# Patient Record
Sex: Male | Born: 1990 | Race: White | Hispanic: No | Marital: Single | Smoking: Never smoker
Health system: Southern US, Community
[De-identification: ages and names within clinical notes are randomized; demographics above are authoritative.]

## PROBLEM LIST (undated history)

## (undated) DIAGNOSIS — J45909 Unspecified asthma, uncomplicated: Secondary | ICD-10-CM

## (undated) HISTORY — PX: TONSILLECTOMY: SUR1361

---

## 2016-02-24 ENCOUNTER — Encounter (HOSPITAL_BASED_OUTPATIENT_CLINIC_OR_DEPARTMENT_OTHER): Payer: Self-pay | Admitting: *Deleted

## 2016-02-24 ENCOUNTER — Emergency Department (HOSPITAL_BASED_OUTPATIENT_CLINIC_OR_DEPARTMENT_OTHER): Payer: 59

## 2016-02-24 ENCOUNTER — Emergency Department (HOSPITAL_BASED_OUTPATIENT_CLINIC_OR_DEPARTMENT_OTHER)
Admission: EM | Admit: 2016-02-24 | Discharge: 2016-02-24 | Disposition: A | Discharge status: Home / Self Care | Payer: 59 | Attending: Emergency Medicine | Admitting: Emergency Medicine

## 2016-02-24 DIAGNOSIS — J45909 Unspecified asthma, uncomplicated: Secondary | ICD-10-CM | POA: Insufficient documentation

## 2016-02-24 DIAGNOSIS — R1030 Lower abdominal pain, unspecified: Secondary | ICD-10-CM | POA: Insufficient documentation

## 2016-02-24 DIAGNOSIS — R110 Nausea: Secondary | ICD-10-CM | POA: Insufficient documentation

## 2016-02-24 DIAGNOSIS — R1084 Generalized abdominal pain: Secondary | ICD-10-CM

## 2016-02-24 HISTORY — DX: Unspecified asthma, uncomplicated: J45.909

## 2016-02-24 LAB — LIPASE, BLOOD: Lipase: 22 U/L (ref 11–51)

## 2016-02-24 LAB — BASIC METABOLIC PANEL
ANION GAP: 5 (ref 5–15)
BUN: 12 mg/dL (ref 6–20)
CHLORIDE: 105 mmol/L (ref 101–111)
CO2: 30 mmol/L (ref 22–32)
Calcium: 9.3 mg/dL (ref 8.9–10.3)
Creatinine, Ser: 0.98 mg/dL (ref 0.61–1.24)
GFR calc non Af Amer: >60 mL/min (ref >60)
Glucose, Bld: 78 mg/dL (ref 65–99)
POTASSIUM: 4.1 mmol/L (ref 3.5–5.1)
Sodium: 140 mmol/L (ref 135–145)

## 2016-02-24 LAB — URINALYSIS, ROUTINE W REFLEX MICROSCOPIC
Bilirubin Urine: NEGATIVE
Glucose, UA: NEGATIVE mg/dL
Hgb urine dipstick: NEGATIVE
Ketones, ur: NEGATIVE mg/dL
LEUKOCYTES UA: NEGATIVE
NITRITE: NEGATIVE
PH: 7 (ref 5.0–8.0)
Protein, ur: NEGATIVE mg/dL
SPECIFIC GRAVITY, URINE: 1.025 (ref 1.005–1.030)

## 2016-02-24 LAB — CBC WITH DIFFERENTIAL/PLATELET
Basophils Absolute: 0.1 10*3/uL (ref 0.0–0.1)
Basophils Relative: 1 %
Eosinophils Absolute: 0.9 10*3/uL — ABNORMAL HIGH (ref 0.0–0.7)
Eosinophils Relative: 15 %
HEMATOCRIT: 41.4 % (ref 39.0–52.0)
HEMOGLOBIN: 14.9 g/dL (ref 13.0–17.0)
LYMPHS ABS: 2.5 10*3/uL (ref 0.7–4.0)
LYMPHS PCT: 43 %
MCH: 32.6 pg (ref 26.0–34.0)
MCHC: 36 g/dL (ref 30.0–36.0)
MCV: 90.6 fL (ref 78.0–100.0)
Monocytes Absolute: 0.6 10*3/uL (ref 0.1–1.0)
Monocytes Relative: 11 %
NEUTROS ABS: 1.8 10*3/uL (ref 1.7–7.7)
Neutrophils Relative %: 30 %
PLATELETS: 261 10*3/uL (ref 150–400)
RBC: 4.57 MIL/uL (ref 4.22–5.81)
RDW: 12.6 % (ref 11.5–15.5)
WBC: 5.9 10*3/uL (ref 4.0–10.5)

## 2016-02-24 LAB — AMYLASE: Amylase: 45 U/L (ref 28–100)

## 2016-02-24 MED ORDER — ONDANSETRON 4 MG PO TBDP: 4.0000 mg | ORAL_TABLET | Freq: Three times a day (TID) | ORAL | 0 refills | Status: AC | PRN

## 2016-02-24 MED ORDER — DICYCLOMINE HCL 20 MG PO TABS: 20.0000 mg | ORAL_TABLET | Freq: Two times a day (BID) | ORAL | 0 refills | Status: AC

## 2016-02-24 MED ORDER — ONDANSETRON 4 MG PO TBDP
4.0000 mg | ORAL_TABLET | Freq: Once | ORAL | Status: AC
  Administered 2016-02-24: 4 mg via ORAL
  Filled 2016-02-24: qty 1

## 2016-02-24 MED ORDER — DICYCLOMINE HCL 10 MG PO CAPS
10.0000 mg | ORAL_CAPSULE | Freq: Once | ORAL | Status: AC
  Administered 2016-02-24: 10 mg via ORAL
  Filled 2016-02-24: qty 1

## 2016-02-24 MED ORDER — POLYETHYLENE GLYCOL 3350 17 G PO PACK
17.0000 g | PACK | Freq: Every day | ORAL | 0 refills | Status: AC
Start: 2016-02-24 — End: ?

## 2016-02-24 MED ORDER — SUCRALFATE 1 G PO TABS
1.0000 g | ORAL_TABLET | Freq: Once | ORAL | Status: AC
  Administered 2016-02-24: 1 g via ORAL
  Filled 2016-02-24: qty 1

## 2016-02-24 NOTE — ED Provider Notes (Signed)
Emergency Department Provider Note  By signing my name below, I, Majel Homer, attest that this documentation has been prepared under the direction and in the presence of Maia Plan, MD . Electronically Signed: Majel Homer, Scribe. 02/24/2016. 1:11 PM.  I have reviewed the triage vital signs and the nursing notes.  HISTORY  Chief Complaint Abdominal Pain  HPI Comments: [name] is a 26 y.o. male who presents to the Emergency Department complaining of gradually worsening, non-radiating, intermittent, midline and suprapubic abdominal pain that began ~1 week ago. Cramping in quality and moderate in severity. Pt reports his pain is exacerbated after eating with associated abdominal distension and decreased appetite. He states he has recently been overseas in [place] and has been back in the [place] for 4 days. He notes he visited UC yesterday in which he received an X-ray of his abdomen that revealed a "large air pocket" and was told of a possible tape worm. He states he was prescribed a 2-day regimen of Biltricide yesterday and has also taken Gas-X with no relief of his pain. He denies any vomiting or diarrhea.    Past Medical History:  Diagnosis Date  . Asthma    There are no active problems to display for this patient.  Past Surgical History:  Procedure Laterality Date  . TONSILLECTOMY     Current Outpatient Rx  . Order #: [id number] Class: Historical Med  . Order #: [id number] Class: Print  . Order #: [id number] Class: Print  . Order #: [id number] Class: Print  . Order #: [id number] Class: Print   Allergies Patient has no known allergies.  No family history on file.  Social History Social History  Substance Use Topics  . Smoking status: Never Smoker  . Smokeless tobacco: Never Used  . Alcohol use Yes   Review of Systems  Constitutional: No fever/chills Eyes: No visual changes. ENT: No sore throat. Cardiovascular: Denies chest pain. Respiratory: Denies  shortness of breath. Gastrointestinal: Positive abdominal pain. Positive nausea, no vomiting.  No diarrhea.  No constipation. Genitourinary: Negative for dysuria. Musculoskeletal: Negative for back pain. Skin: Negative for rash. Neurological: Negative for headaches, focal weakness or numbness.  10-point ROS otherwise negative.  ____________________________________________   PHYSICAL EXAM:  VITAL SIGNS: ED Triage Vitals  Enc Vitals Group     BP 02/24/16 1918 133/89     Pulse Rate 02/24/16 1918 (!) 50     Resp 02/24/16 1918 20     Temp 02/24/16 1918 97.6 F (36.4 C)     Temp Source 02/24/16 1918 Oral     SpO2 02/24/16 1918 99 %     Weight 02/24/16 1915 148 lb (67.1 kg)     Height 02/24/16 1915 5\' 7"  (1.702 m)     Pain Score 02/24/16 1916 5    Constitutional: Alert and oriented. Well appearing and in no acute distress. Eyes: Conjunctivae are normal.  Head: Atraumatic. Nose: No congestion/rhinnorhea. Mouth/Throat: Mucous membranes are moist.  Neck: No stridor.  Cardiovascular: Normal rate, regular rhythm. Good peripheral circulation. Grossly normal heart sounds.   Respiratory: Normal respiratory effort.  No retractions. Lungs CTAB. Gastrointestinal: Soft and nontender throughout. No rebound or guarding. No distention.  Musculoskeletal: No lower extremity tenderness nor edema. No gross deformities of extremities. Neurologic:  Normal speech and language. No gross focal neurologic deficits are appreciated.  Skin:  Skin is warm, dry and intact. No rash noted. Psychiatric: Mood and affect are normal. Speech and behavior are normal.  ____________________________________________   LABS (all labs ordered  are listed, but only abnormal results are displayed)  Labs Reviewed  CBC WITH DIFFERENTIAL/PLATELET - Abnormal; Notable for the following:       Result Value   Eosinophils Absolute 0.9 (*)    All other components within normal limits  URINALYSIS, ROUTINE W REFLEX MICROSCOPIC  - Abnormal; Notable for the following:    APPearance CLOUDY (*)    All other components within normal limits  AMYLASE  LIPASE, BLOOD  BASIC METABOLIC PANEL    ____________________________________________  RADIOLOGY  Dg Abdomen Acute W/chest  Result Date: 02/24/2016 CLINICAL DATA:  Abdominal pain.  Asthma. EXAM: DG ABDOMEN ACUTE W/ 1V CHEST COMPARISON:  None. FINDINGS: PA chest: Lungs are clear. Heart size and pulmonary vascularity are normal. No adenopathy. Supine and upright abdomen: There is moderate stool throughout the colon. There is no bowel dilatation or air-fluid levels suggesting bowel obstruction. No free air. No abnormal calcifications. IMPRESSION: No bowel obstruction or free air. Moderate stool in colon. Lungs clear. Electronically Signed   By: Bretta BangWilliam  Woodruff III M.D.   On: 02/24/2016 21:34   ____________________________________________  PROCEDURES  Procedure(s) performed:   Procedures   None ____________________________________________   INITIAL IMPRESSION / ASSESSMENT AND PLAN / ED COURSE  Pertinent labs & imaging results that were available during my care of the patient were reviewed by me and considered in my medical decision making (see chart for details).  Patient presents to the ED with intermittent abdominal pain and [facility] Abdomen is soft and non-tender with normal labs. No indication for repeat CT at this time. Acute abdomen x-ray shows no free air which correlates well clinically. Does have moderate stool which I suspect is causing these symptoms. No evidence of worm or other paracytic infection at this time. Feeling better after PO medications. Will work to relieve constipation at home and follow with PCP PRN.   At this time, I do not feel there is any life-threatening condition present. I have reviewed and discussed all results (EKG, imaging, lab, urine as appropriate), exam findings with patient. I have reviewed  nursing notes and appropriate previous records.  I feel the patient is safe to be discharged home without further emergent workup. Discussed usual and customary return precautions. Patient and family (if present) verbalize understanding and are comfortable with this plan.  Patient will follow-up with their primary care provider. If they do not have a primary care provider, information for follow-up has been provided to them. All questions have been answered.  I personally performed the services described in this documentation, which was scribed in my presence. The recorded information has been reviewed and is accurate.  ____________________________________________  FINAL CLINICAL IMPRESSION(S) / ED DIAGNOSES  Final diagnoses:  Generalized abdominal pain   MEDICATIONS GIVEN DURING THIS VISIT:  Medications  sucralfate (CARAFATE) tablet 1 g (1 g Oral Given 02/24/16 2138)  dicyclomine (BENTYL) capsule 10 mg (10 mg Oral Given 02/24/16 2138)  ondansetron (ZOFRAN-ODT) disintegrating tablet 4 mg (4 mg Oral Given 02/24/16 2137)    NEW OUTPATIENT MEDICATIONS STARTED DURING THIS VISIT:  Discharge Medication List as of 02/24/2016  9:47 PM    START taking these medications   Details  !! dicyclomine (BENTYL) 20 MG tablet Take 1 tablet (20 mg total) by mouth 2 (two) times daily., Starting Tue 02/24/2016, Print    !! dicyclomine (BENTYL) 20 MG tablet Take 1 tablet (20 mg total) by mouth 2 (two) times daily., Starting Tue 02/24/2016, Print    ondansetron (  ZOFRAN ODT) 4 MG disintegrating tablet Take 1 tablet (4 mg total) by mouth every 8 (eight) hours as needed for nausea or vomiting., Starting Tue 02/24/2016, Print    polyethylene glycol (MIRALAX) packet Take 17 g by mouth daily., Starting Tue 02/24/2016, Print     !! - Potential duplicate medications found. Please discuss with provider.     Note:  This document was prepared using Dragon voice recognition software and may include unintentional dictation  errors.  Alona Bene, MD Emergency Medicine   Maia Plan, MD 02/25/16 [phone]

## 2016-02-24 NOTE — ED Notes (Signed)
patient is walking out in no distress eating pretzels.

## 2016-02-24 NOTE — Discharge Instructions (Signed)

## 2016-02-24 NOTE — ED Triage Notes (Signed)
Abdominal pain for a week. [facility] and he had a large gas pocket and told he might have tape worm. He was given Biltricide 2 day regiment. Pain and bloating is the same.

## 2017-11-25 IMAGING — CR DG ABDOMEN ACUTE W/ 1V CHEST
4 series · 4 of 4 positions shown · non-contrast
Comparison: None.

CLINICAL DATA: Abdominal pain.  Asthma.

EXAM:
DG ABDOMEN ACUTE W/ 1V CHEST

[w chest pa]
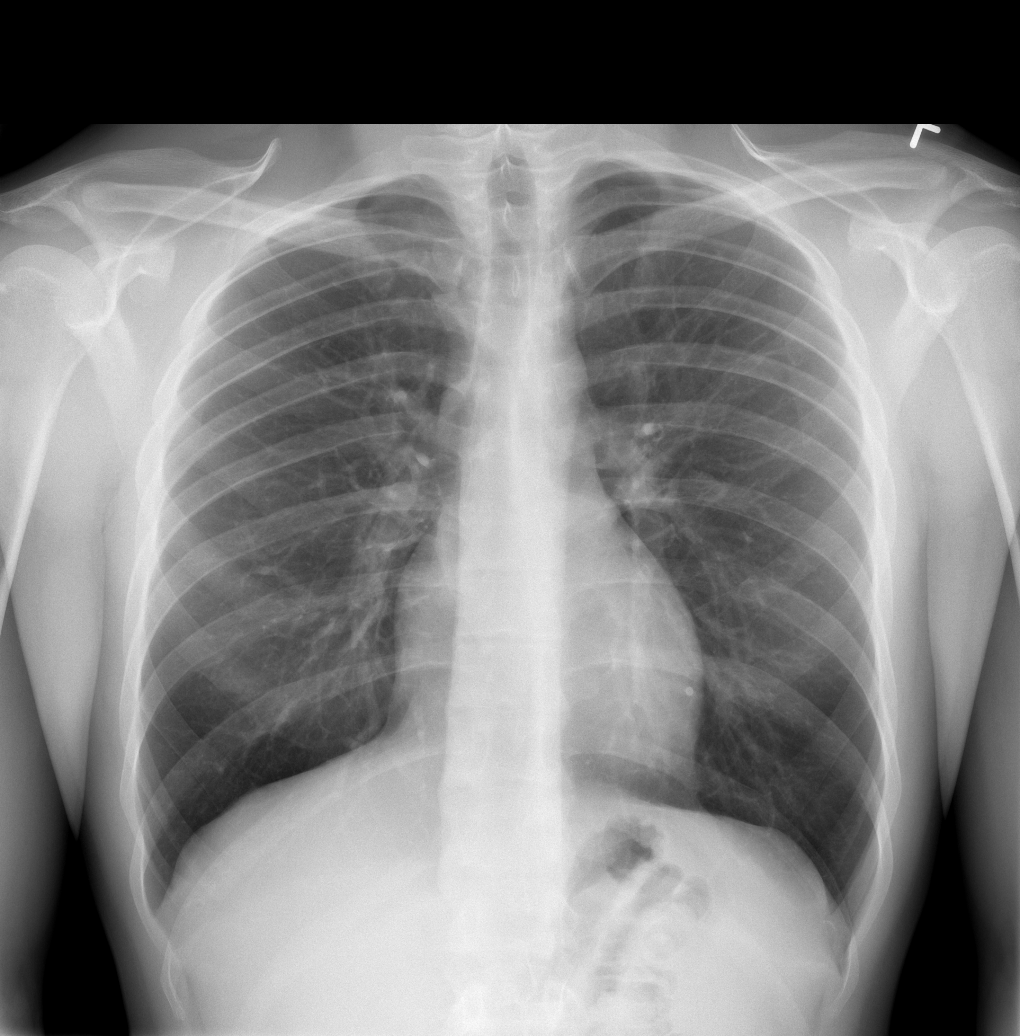

[w abdomen upright]
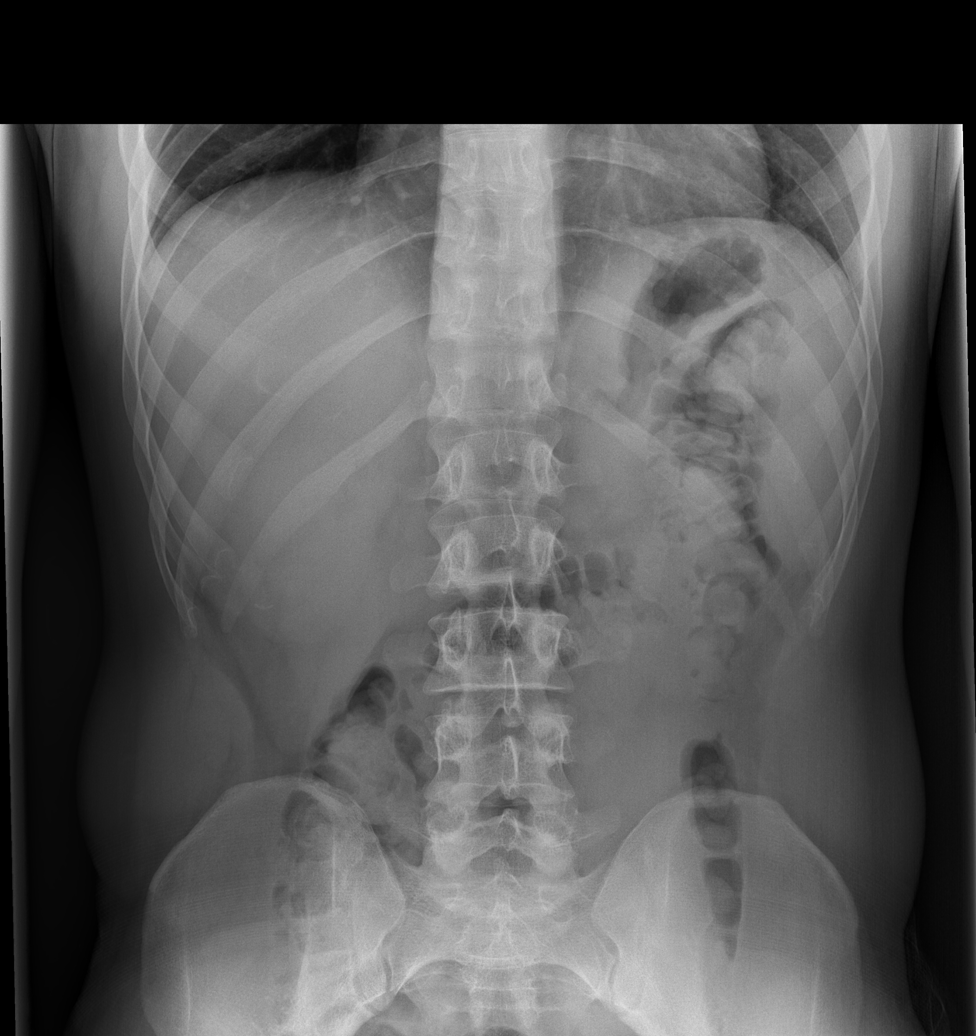

[t abdomen supine (1 of 2)]
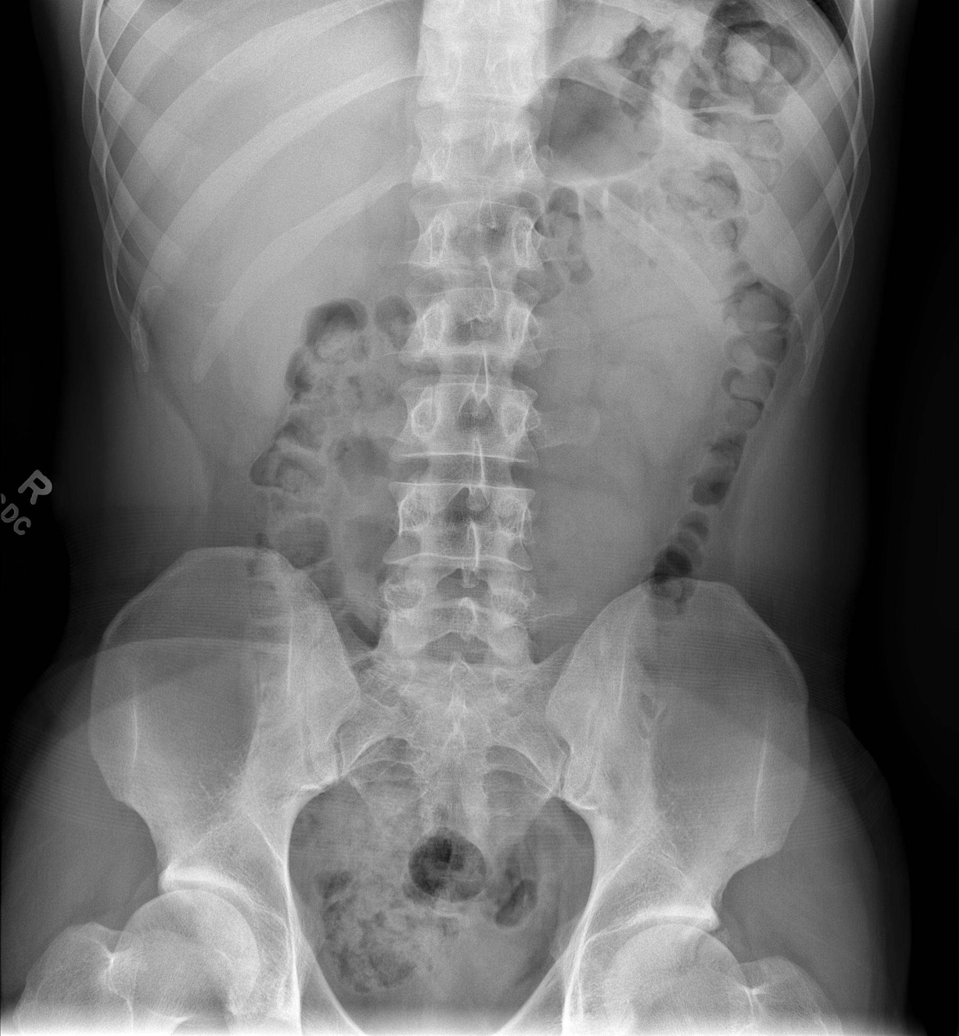

[t abdomen supine (2 of 2)]
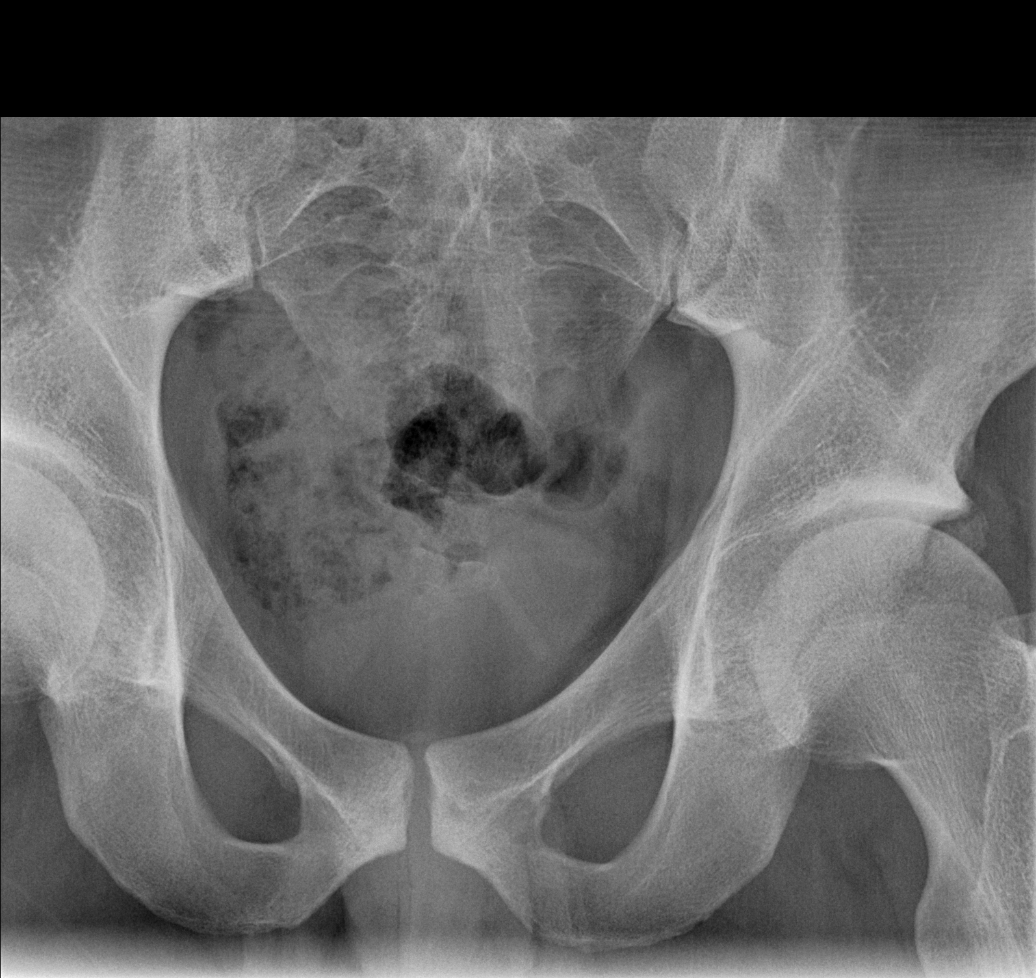

[4 of 4 positions shown; findings below may reference images not displayed]

FINDINGS: PA chest: Lungs are clear. Heart size and pulmonary vascularity are
normal. No adenopathy.

Supine and upright abdomen: There is moderate stool throughout the
colon. There is no bowel dilatation or air-fluid levels suggesting
bowel obstruction. No free air. No abnormal calcifications.
IMPRESSION: No bowel obstruction or free air. Moderate stool in colon. Lungs
clear.

## 2023-08-16 ENCOUNTER — Ambulatory Visit (INDEPENDENT_AMBULATORY_CARE_PROVIDER_SITE_OTHER): Admitting: Physician Assistant

## 2023-08-16 ENCOUNTER — Ambulatory Visit (INDEPENDENT_AMBULATORY_CARE_PROVIDER_SITE_OTHER): Admitting: Audiology

## 2023-08-16 VITALS — BP 124/84 | HR 70

## 2023-08-16 DIAGNOSIS — H9011 Conductive hearing loss, unilateral, right ear, with unrestricted hearing on the contralateral side: Secondary | ICD-10-CM

## 2023-08-16 NOTE — Progress Notes (Signed)
  [address] [place], [place] [address] [phone]  Audiological Evaluation    Name: [name]     DOB:   October 10, 1990      MRN:   [id number]                                                                                     Service Date: 08/16/2023     Accompanied by: unaccompanied   Patient comes today after Reyes Cohen, PA-C sent a referral for a hearing evaluation due to concerns with right ear hearing loss.   Symptoms Yes Details  Hearing loss  [x]  Right sided hearing loss for a while - unable to determine.  Tinnitus  []    Ear pain/ infections/pressure  [x]  History of ear infections  Balance problems  []    Noise exposure history  []    Previous ear surgeries  [x]  Multiple PE tubes as a child and in 2018 had eardrum reconstruction after having some bleeding and also was told to have a boil.  Family history of hearing loss  []    Amplification  []    Other  []        Tympanometry: Right ear: Type B- Normal external ear canal volume with no middle ear pressure peak or tympanic membrane compliance. Left ear: Type A- Normal external ear canal volume with normal middle ear pressure and tympanic membrane compliance.   Pure tone Audiometry: Right ear- Normal to mild conductive hearing loss from 125 Hz - 8000 Hz. Left ear-  Normal hearing  from 125 Hz - 8000 Hz.  Speech Audiometry: Right ear- Speech Reception Threshold (SRT) was obtained at 25 dBHL. Left ear-Speech Reception Threshold (SRT) was obtained at 10 dBHL.   Word Recognition Score Tested using NU-6 (recorded) Right ear: 100% was obtained at a presentation level of 65 dBHL with contralateral masking which is deemed as  excellent. Left ear: 100% was obtained at a presentation level of 50 dBHL with contralateral masking which is deemed as  excellent.   The hearing test results were completed under headphones and results are deemed to be of good reliability. Test technique:  conventional       Recommendations: Follow up with ENT as scheduled for today. Repeat audiogram after medical care.   Aldina Porta MARIE LEROUX-MARTINEZ, AUD

## 2023-08-16 NOTE — Patient Instructions (Addendum)
 I have ordered an imaging study for you to complete prior to your next visit. Please call [facility] Scheduling at [phone] to schedule your imaging if you have not received a call within 24 hours. If you are unable to complete your imaging study prior to your next scheduled visit please call our office to let us  know.     Call our office once your image is scheduled. I would like to see you in the office 2-3 weeks after your image is completed .

## 2023-08-16 NOTE — Progress Notes (Signed)
 Dear Dr. Alvera, Here is my assessment for our mutual patient, [name]. Thank you for allowing me the opportunity to care for your patient. Please do not hesitate to contact me should you have any other questions. Sincerely, Chyrl Cohen PA-C  Otolaryngology Clinic Note Referring provider: Dr. Alvera HPI:  [name] is a 33 y.o. male kindly referred by Dr. Alvera   The patient is a 33 year old gentleman seen in our office for evaluation of hearing loss.  The patient notes a significant past medical history of recurrent otitis media as a child.  He notes he had 4 surgeries for tympanostomy tubes.  Notes he did eventually outgrow his otitis media.  He notes in 2018 he had tympanoplasty performed in [place] on the right side.  He notes ongoing hearing loss on the right side.  He notes over the last 5 years he has had persistent decreased hearing on the right when compared to the left, he also notes that when he has a cold or significant allergies he gets muffled hearing on the right and it takes some time to get back to its normal baseline.  He has a history of seasonal allergies and uses daily antihistamine and Flonase.  He denies any ringing in the ear, no significant pain, no dizziness.  He did have hearing screening test approximately 1 year ago but no formal audiological evaluation recently.  He notes his last ear infection was in 2023.   Independent Review of Additional Tests or Records:  Audiological evaluation 08/16/2023    Tympanometry: Right ear: Type B- Normal external ear canal volume with no middle ear pressure peak or tympanic membrane compliance. Left ear: Type A- Normal external ear canal volume with normal middle ear pressure and tympanic membrane compliance.   Pure tone Audiometry: Right ear- Normal to mild conductive hearing loss from 125 Hz - 8000 Hz. Left ear-  Normal hearing  from 125 Hz - 8000 Hz.   Speech Audiometry: Right ear- Speech Reception  Threshold (SRT) was obtained at 25 dBHL. Left ear-Speech Reception Threshold (SRT) was obtained at 10 dBHL.   Word Recognition Score Tested using NU-6 (recorded) Right ear: 100% was obtained at a presentation level of 65 dBHL with contralateral masking which is deemed as  excellent. Left ear: 100% was obtained at a presentation level of 50 dBHL with contralateral masking which is deemed as  excellent.   The hearing test results were completed under headphones and results are deemed to be of good reliability. 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   Social Connections: Not on file      Current Outpatient Medications:    budesonide (PULMICORT FLEXHALER) 180 MCG/ACT inhaler, Inhale 2 puffs into the lungs as needed (asthma)., Disp: , Rfl:    cetirizine (ZYRTEC ALLERGY) 10 MG tablet, Take 10 mg by mouth daily., Disp: , Rfl:    fluticasone (FLONASE) 50 MCG/ACT nasal spray, Place 2 sprays into both nostrils daily. prn, Disp: , Rfl:    dicyclomine  (BENTYL ) 20 MG tablet, Take 1 tablet (20 mg total) by mouth 2 (two) times daily. (Patient not taking: Reported on 08/16/2023), Disp: 20 tablet, Rfl: 0   dicyclomine  (BENTYL ) 20 MG tablet, Take 1 tablet (  20 mg total) by mouth 2 (two) times daily. (Patient not taking: Reported on 08/16/2023), Disp: 20 tablet, Rfl: 0   ondansetron  (ZOFRAN  ODT) 4 MG disintegrating tablet, Take 1 tablet (4 mg total) by mouth every 8 (eight) hours as needed for nausea or vomiting. (Patient not taking: Reported on 08/16/2023), Disp: 20 tablet, Rfl: 0   polyethylene glycol (MIRALAX ) packet, Take 17 g by mouth daily. (Patient not taking: Reported on 08/16/2023), Disp: 14 each, Rfl: 0   THEOPHYLLINE PO, Take by mouth. (Patient not taking:  Reported on 08/16/2023), Disp: , Rfl:    Physical Exam:   BP (!) 138/96   Pulse 70   SpO2 98%   Pertinent Findings  CN II-XII intact Bilateral EAC clear and TM intact , left TM with sclerosis in the inferior portion, right TM with sclerosis throughout unable to visualize middle ear space Anterior rhinoscopy: Septum midline; bilateral inferior turbinates with minimal hypertrophy  No lesions of oral cavity/oropharynx; dentition wnl No obviously palpable neck masses/lymphadenopathy/thyromegaly No respiratory distress or stridor  Seprately Identifiable Procedures:  None  Impression & Plans:  [name] is a 33 y.o. 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  Patient has a rather significant ear history with tympanoplasty on the right and recurrent ear infections as a child.  Some of the symptoms sound consistent with eustachian tube dysfunction given the worsening muffled hearing which takes time to resolve after infection or significant allergies.  Given his previous surgical history, the resistant decreased hearing on the right as well as the type B tympanometry I do think it is reasonable to obtain a CT temporal bone to evaluate the sided hearing loss.  These results are available and have see the patient back in the office to discuss the findings and further recommendations.   - f/u 3 weeks after Ct complete    Thank you for allowing me the opportunity to care for your patient. Please do not hesitate to contact me should you have any other questions.  Sincerely, Chyrl Cohen PA-C [facility] Phone: [phone] Fax: [phone]  08/16/2023, 10:03 AM

## 2023-09-16 ENCOUNTER — Ambulatory Visit (HOSPITAL_COMMUNITY)
Admission: RE | Admit: 2023-09-16 | Discharge: 2023-09-16 | Disposition: A | Discharge status: Home / Self Care | Source: Ambulatory Visit | Attending: Physician Assistant | Admitting: Physician Assistant

## 2023-09-16 DIAGNOSIS — H9011 Conductive hearing loss, unilateral, right ear, with unrestricted hearing on the contralateral side: Secondary | ICD-10-CM | POA: Insufficient documentation

## 2023-09-16 DIAGNOSIS — H7581 Other specified disorders of right middle ear and mastoid in diseases classified elsewhere: Secondary | ICD-10-CM | POA: Diagnosis not present

## 2023-09-16 DIAGNOSIS — H9191 Unspecified hearing loss, right ear: Secondary | ICD-10-CM | POA: Diagnosis not present

## 2023-10-04 ENCOUNTER — Encounter (INDEPENDENT_AMBULATORY_CARE_PROVIDER_SITE_OTHER): Payer: Self-pay | Admitting: Physician Assistant

## 2023-10-04 ENCOUNTER — Ambulatory Visit (INDEPENDENT_AMBULATORY_CARE_PROVIDER_SITE_OTHER): Admitting: Physician Assistant

## 2023-10-04 VITALS — BP 123/76 | HR 64 | Temp 98.0°F

## 2023-10-04 DIAGNOSIS — H9011 Conductive hearing loss, unilateral, right ear, with unrestricted hearing on the contralateral side: Secondary | ICD-10-CM | POA: Diagnosis not present

## 2023-10-04 NOTE — Progress Notes (Signed)
 Dear Dr. Vernon, Here is my assessment for our mutual [name], Edward [name] [name]. Thank you for allowing me Edward [name] opportunity to care for your [name]. Please do not hesitate to contact me should you have any other questions. Sincerely, Chyrl Cohen PA-C  Otolaryngology Clinic Note Referring provider: Dr. Vernon HPI:  Edward [name] [name] is a 33 y.o. male kindly referred by Dr. Vernon   Edward [name] [name] is a 33 year old gentleman seen in our office for follow-up evaluation of right sided hearing loss.  Edward [name] [name] was last seen in Edward [name] office on 08/16/2023.  Below is a recap of that encounter.  Edward [name] [name] is a 33 year old gentleman seen in our office for evaluation of hearing loss.  Edward [name] [name] notes a significant past medical history of recurrent otitis media as a child.  He notes he had 4 surgeries for tympanostomy tubes.  Notes he did eventually outgrow his otitis media.  He notes in 2018 he had tympanoplasty performed in [place] on Edward [name] right side.  He notes ongoing hearing loss on Edward [name] right side.  He notes over Edward [name] last 5 years he has had persistent decreased hearing on Edward [name] right when compared to Edward [name] left, he also notes that when he has a cold or significant allergies he gets muffled hearing on Edward [name] right and it takes some time to get back to its normal baseline.  He has a history of seasonal allergies and uses daily antihistamine and Flonase.  He denies any ringing in Edward [name] ear, no significant pain, no dizziness.  He did have hearing screening test approximately 1 year ago but no formal audiological evaluation recently.  He notes his last ear infection was in 2023   Update 10/04/2023.  Since his last office visit Edward [name] [name] denies any significant changes or complaints.  Continue right sided hearing deficits, no recent infections.  He did have a CT that was completed on 09/16/2023 showing Postsurgical changes on Edward [name] right. There is abnormal opacification of Edward [name] mastoids, mesotympanum and epitympanum with  abnormal tissue around Edward [name] incus and malleolus. 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 There is no bone destruction to suggest cholesteatoma.  Independent Review of Additional Tests or Records:  CT IAC on 09/16/2023   PMH/Meds/All/SocHx/FamHx/ROS:   Past Medical History:  Diagnosis Date   Asthma      Past Surgical History:  Procedure Laterality Date   TONSILLECTOMY      History reviewed. No pertinent family history.   Social Connections: Not on file      Current Outpatient Medications:    budesonide (PULMICORT FLEXHALER) 180 MCG/ACT inhaler, Inhale 2 puffs into Edward [name] lungs as needed (asthma)., Disp: , Rfl:    cetirizine (ZYRTEC ALLERGY) 10 MG tablet, Take 10 mg by mouth daily., Disp: , Rfl:    dicyclomine  (BENTYL ) 20 MG tablet, Take 1 tablet (20 mg total) by mouth 2 (two) times daily. ([name] not taking: Reported on 08/16/2023), Disp: 20 tablet, Rfl: 0   dicyclomine  (BENTYL ) 20 MG tablet, Take 1 tablet (20 mg total) by mouth 2 (two) times daily. ([name] not taking: Reported on 08/16/2023), Disp: 20 tablet, Rfl: 0   fluticasone (FLONASE) 50 MCG/ACT nasal spray, Place 2 sprays into both nostrils daily. prn, Disp: , Rfl:    ondansetron  (ZOFRAN  ODT) 4 MG disintegrating tablet, Take 1 tablet (4 mg total) by mouth every 8 (eight) hours as needed for nausea or vomiting. ([name] not taking: Reported on 08/16/2023), Disp: 20 tablet, Rfl: 0   polyethylene glycol (MIRALAX ) packet, Take 17 g by  mouth daily. ([name] not taking: Reported on 08/16/2023), Disp: 14 each, Rfl: 0   THEOPHYLLINE PO, Take by mouth. ([name] not taking: Reported on 08/16/2023), Disp: , Rfl:    Physical Exam:   BP 123/76   Pulse 64   Temp 98 F (36.7 C)   SpO2 98%  Pertinent Findings  CN II-XII intact Bilateral EAC clear and TM intact , left TM with sclerosis in Edward [name] inferior portion, right TM with sclerosis throughout unable to visualize middle ear space, minimal retraction No obviously palpable neck  masses/lymphadenopathy/thyromegaly No respiratory distress or stridor  Seprately Identifiable Procedures:  None  Impression & Plans:  Edward [name] [name] is a 33 y.o. male with Edward [name] following   Right-sided hearing loss-  33 year old gentleman seen today for right sided hearing loss.  He has conductive hearing loss on Edward [name] right with air-bone gaps.  CT reveals abnormal opacification of Edward [name] mastoids, mesotympanum and epitympanum with abnormal tissue around Edward [name] incus and malleolus.  I discussed case with Dr. Tobie, who feels a tympanoplasty and mastoidectomy would be an reasonable approach given his findings.  I discussed Edward [name] options with Edward [name] [name], he would like to speak further with Dr. Tobie about surgical intervention.  He will be placed on his schedule.  Edward [name] [name] will reach out to me if he develops any new or worsening signs or symptoms in Edward [name] meantime.   - f/u office visit with Dr. Tobie   Thank you for allowing me Edward [name] opportunity to care for your [name]. Please do not hesitate to contact me should you have any other questions.  Sincerely, Chyrl Cohen PA-C [facility] Phone: [phone] Fax: [phone]  10/04/2023, 10:36 AM

## 2023-10-18 ENCOUNTER — Encounter (INDEPENDENT_AMBULATORY_CARE_PROVIDER_SITE_OTHER): Payer: Self-pay | Admitting: Otolaryngology

## 2023-10-18 ENCOUNTER — Ambulatory Visit (INDEPENDENT_AMBULATORY_CARE_PROVIDER_SITE_OTHER): Admitting: Otolaryngology

## 2023-10-18 VITALS — BP 123/88 | HR 82 | Ht 67.0 in | Wt 153.0 lb

## 2023-10-18 DIAGNOSIS — H6993 Unspecified Eustachian tube disorder, bilateral: Secondary | ICD-10-CM | POA: Diagnosis not present

## 2023-10-18 DIAGNOSIS — H7011 Chronic mastoiditis, right ear: Secondary | ICD-10-CM | POA: Diagnosis not present

## 2023-10-18 DIAGNOSIS — H9011 Conductive hearing loss, unilateral, right ear, with unrestricted hearing on the contralateral side: Secondary | ICD-10-CM

## 2023-10-18 DIAGNOSIS — J3089 Other allergic rhinitis: Secondary | ICD-10-CM | POA: Diagnosis not present

## 2023-10-18 MED ORDER — PREDNISONE 10 MG PO TABS: 20.0000 mg | ORAL_TABLET | Freq: Every day | ORAL | 0 refills | Status: AC

## 2023-10-18 NOTE — Patient Instructions (Addendum)
 CPT [id number]; CPT [id number], CPT [phone]

## 2023-10-18 NOTE — Progress Notes (Signed)
 Dear Dr. Vernon, Here is my assessment for our mutual patient, [name]. Thank you for allowing me the opportunity to care for your patient. Please do not hesitate to contact me should you have any other questions. Sincerely, Dr. Eldora Blanch  Otolaryngology Clinic Note Referring provider: Dr. Vernon HPI:  [name] is a 33 y.o. male kindly referred by Dr. Vernon for evaluation of right ear fullness and chronic mastoiditis.   Initially saw [name] this summer who did initial work up, with history as below. Noted hearing loss, multiple tymp tubes with right tympanomastoidectomy in [place] with persistent right hearing loss and ETD symptoms, now worse.   --------------------------------------------------------- 10/18/2023 Discussed the use of AI scribe software for clinical note transcription with the patient, who gave verbal consent to proceed.  History of Present Illness [name] is a 33 year old male with chronic ear infections and tympanomastectomy who presents with hearing loss and ear congestion.  He has a history of chronic ear infections with multiple ear tube placements. In 2018, he underwent a tympanomastectomy in [place] due to suspected TM perforation and infection.  Over the past three years, his symptoms have worsened, particularly during allergy season. He experiences significant congestion and hearing loss, mainly in the right ear, though both ears currently feel clogged/muffled hearing. Some postauricular pressure. The right ear has significant hearing loss, while the left ear is described as doing well. His right ear typically does not 'pop' during pressure changes. He avoids water sports or activities that might worsen his ear issues. 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  He experiences no pain in the right ear or drainage. He uses Flonase and a Zyrtec  daily to manage symptoms.  No prior allergy testing or significant frequent sinus infections. No barotrauma, no FHX of early onset hearing loss  H&N Surgery: right tympanomastoidectomy - [place]; multiple prior BTT Personal or FHx of bleeding dz or anesthesia difficulty: no   GLP-1: no AP/AC: no  Tobacco: no  Independent Review of Additional Tests or Records:  CT Temporal bones 09/16/2023 independently interpreted: right prior tympanomastoidectomy with tegmen on right at least thin, with TM retracted; unclear if IS joint intact or fibrous union; otherwise well aerated mastoid and ME well on left  06/13/2023 Audiogram was independently reviewed and interpreted by me and it reveals - left ear normal hearing threshold; right ear conductive hearing loss with ABG approx 30 dB with WRT 100% AU at 25/10 AD/AS. AD/AS B/A tymps.    SNHL= Sensorineural hearing loss    PMH/Meds/All/SocHx/FamHx/ROS:   Past Medical History:  Diagnosis Date   Asthma      Past Surgical History:  Procedure Laterality Date   TONSILLECTOMY      History reviewed. No pertinent family history.   Social Connections: Not on file      Current Outpatient Medications:    budesonide (PULMICORT FLEXHALER) 180 MCG/ACT inhaler, Inhale 2 puffs into the lungs as needed (asthma)., Disp: , Rfl:    cetirizine (ZYRTEC ALLERGY) 10 MG tablet, Take 10 mg by mouth daily., Disp: , Rfl:    fluticasone (FLONASE) 50 MCG/ACT nasal spray, Place 2 sprays into both nostrils daily. prn, Disp: , Rfl:    predniSONE (DELTASONE) 10 MG tablet, Take 2 tablets (20 mg total) by mouth daily with breakfast for 7 days., Disp: 14 tablet, Rfl: 0   dicyclomine  (BENTYL ) 20 MG tablet, Take 1  tablet (20 mg total) by mouth 2 (two) times daily. (Patient not taking: Reported on 10/18/2023), Disp: 20 tablet, Rfl: 0   dicyclomine  (BENTYL ) 20 MG tablet, Take 1 tablet (20 mg total) by mouth 2 (two) times daily. (Patient not taking: Reported on 10/18/2023), Disp:  20 tablet, Rfl: 0   ondansetron  (ZOFRAN  ODT) 4 MG disintegrating tablet, Take 1 tablet (4 mg total) by mouth every 8 (eight) hours as needed for nausea or vomiting. (Patient not taking: Reported on 10/18/2023), Disp: 20 tablet, Rfl: 0   polyethylene glycol (MIRALAX ) packet, Take 17 g by mouth daily. (Patient not taking: Reported on 10/18/2023), Disp: 14 each, Rfl: 0   THEOPHYLLINE PO, Take by mouth. (Patient not taking: Reported on 10/18/2023), Disp: , Rfl:    Physical Exam:   BP 123/88 (BP Location: Right Arm, Patient Position: Sitting, Cuff Size: Normal)   Pulse 82   Ht 5' 7 (1.702 m)   Wt 153 lb (69.4 kg)   SpO2 97%   BMI 23.96 kg/m   Salient findings:  CN II-XII intact Given history and complaints, ear microscopy was indicated and performed for evaluation with findings as below in physical exam section and in procedures; Bilateral EAC clear and TM intact with well pneumatized middle ear space -- of note, on right, pars flaccida is retracted without evidence of cholesteatoma; Right postauricular scar well healed Weber 512: right Rinne 512: AC > BC b/l  Anterior rhinoscopy: Septum intact; bilateral inferior turbinates without significant hypertrophy No lesions of oral cavity/oropharynx No obviously palpable neck masses/lymphadenopathy/thyromegaly No respiratory distress or stridor  Seprately Identifiable Procedures:  Prior to initiating any procedures, risks/benefits/alternatives were explained to the patient and verbal consent obtained. Procedure: Bilateral ear microscopy using microscope (CPT [id number]) Pre-procedure diagnosis: right conductive hearing loss Post-procedure diagnosis: same Indication: see above; given patient's otologic complaints and history, for improved and comprehensive examination of external ear and tympanic membrane, bilateral otologic examination using microscope was performed. Prior to proceeding, verbal consent was obtained after discussion of  R/B/A  Procedure: Patient was placed semi-recumbent. Both ear canals were examined using the microscope with findings above. Patient tolerated the procedure well.  Impression & Plans:  [name] is a 33 y.o. male with:  1. Conductive hearing loss of right ear with unrestricted hearing of left ear   2. Chronic mastoiditis of right side   3. Dysfunction of both eustachian tubes   4. Other allergic rhinitis    Chronic right ear conductive hearing loss with ETD and mastoid effusion/c/f chronic mastoiditis status post right tympanomastoidectomy. Having eustachian tube dysfunction symptoms with significant retraction on right.  CT and audio reviewed  We discussed options currently: Do nothing Medical mgmt with steroids HA right side Right tymp tube Right revision tympanomastoidectomy, likely right PE tube and ET dilation  We discussed R/B/A for each option. Given his symptoms which are persistent, do think option 5 seems to be best to ensure ossicular chain intact and for any hearing improvement measures that need to occur. CPT codes provided  He would like to try Pred burst and will think about this. He may be moving out of the country again next summer so if intervention were to occur, would prefer winter/early spring  Continue flonase/zyrtec   - f/u Jan 2026  See below regarding exact medications prescribed this encounter including dosages and route: Meds ordered this encounter  Medications   predniSONE (DELTASONE) 10 MG tablet    Sig: Take 2 tablets (20 mg total) by mouth  daily with breakfast for 7 days.    Dispense:  14 tablet    Refill:  0      Thank you for allowing me the opportunity to care for your patient. Please do not hesitate to contact me should you have any other questions.  Sincerely, Eldora Blanch, MD Otolaryngologist (ENT), [facility] Phone: [phone] Fax: [phone]  10/18/2023, 3:26 PM   MDM:  Level 4 -  99214 Complexity/Problems addressed: mod - chronic problems Data complexity: mod - independent CT interpretation - Morbidity: mod  - Prescription Drug prescribed or managed: y

## 2024-01-24 ENCOUNTER — Ambulatory Visit (INDEPENDENT_AMBULATORY_CARE_PROVIDER_SITE_OTHER): Admitting: Otolaryngology

## 2024-01-24 ENCOUNTER — Encounter (INDEPENDENT_AMBULATORY_CARE_PROVIDER_SITE_OTHER): Payer: Self-pay | Admitting: Otolaryngology

## 2024-01-24 VITALS — BP 127/88 | HR 56 | Temp 98.0°F

## 2024-01-24 DIAGNOSIS — H6993 Unspecified Eustachian tube disorder, bilateral: Secondary | ICD-10-CM

## 2024-01-24 DIAGNOSIS — H7011 Chronic mastoiditis, right ear: Secondary | ICD-10-CM

## 2024-01-24 DIAGNOSIS — H9011 Conductive hearing loss, unilateral, right ear, with unrestricted hearing on the contralateral side: Secondary | ICD-10-CM

## 2024-01-24 NOTE — Progress Notes (Signed)
 Dear Dr. Vernon, Here is my assessment for our mutual patient, [name]. Thank you for allowing me the opportunity to care for your patient. Please do not hesitate to contact me should you have any other questions. Sincerely, Dr. Eldora Blanch  Otolaryngology Clinic Note Referring provider: Dr. Vernon HPI:  [name] is a 34 y.o. male kindly referred by Dr. Vernon for evaluation of right ear fullness and chronic mastoiditis.   Initially saw [name] this summer who did initial work up, with history as below. Noted hearing loss, multiple tymp tubes with right tympanomastoidectomy in [place] with persistent right hearing loss and ETD symptoms, now worse.   --------------------------------------------------------- 10/18/2023 Discussed the use of AI scribe software for clinical note transcription with the patient, who gave verbal consent to proceed.  History of Present Illness [name] is a 34 year old male with chronic ear infections and tympanomastectomy who presents with hearing loss and ear congestion.  He has a history of chronic ear infections with multiple ear tube placements. In 2018, he underwent a tympanomastectomy in [place] due to suspected TM perforation and infection.  Over the past three years, his symptoms have worsened, particularly during allergy season. He experiences significant congestion and hearing loss, mainly in the right ear, though both ears currently feel clogged/muffled hearing. Some postauricular pressure. The right ear has significant hearing loss, while the left ear is described as doing well. His right ear typically does not 'pop' during pressure changes. He avoids water sports or activities that might worsen his ear issues. 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  He experiences no pain in the right ear or drainage. He uses Flonase and a Zyrtec  daily to manage symptoms.  No prior allergy testing or significant frequent sinus infections. No barotrauma, no FHX of early onset hearing loss  --------------------------------------------------------- 01/24/2024 Still having issues with the right ear with feeling of things being clogged and congestion with hearing loss. Some postauricular pressure. Allergy season making things worse. No drainage from the ear. No travel plans.  Pred burst did not help. He wishes to proceed with surgery.  H&N Surgery: right tympanomastoidectomy - [place]; multiple prior BTT Personal or FHx of bleeding dz or anesthesia difficulty: no   GLP-1: no AP/AC: no  Tobacco: no  Independent Review of Additional Tests or Records:  CT Temporal bones 09/16/2023 independently interpreted: right prior tympanomastoidectomy with tegmen on right at least thin, with TM retracted; unclear if IS joint intact or fibrous union; otherwise well aerated mastoid and ME well on left  06/13/2023 Audiogram was independently reviewed and interpreted by me and it reveals - left ear normal hearing threshold; right ear conductive hearing loss with ABG approx 30 dB with WRT 100% AU at 25/10 AD/AS. AD/AS B/A tymps.    SNHL= Sensorineural hearing loss    PMH/Meds/All/SocHx/FamHx/ROS:   Past Medical History:  Diagnosis Date   Asthma      Past Surgical History:  Procedure Laterality Date   TONSILLECTOMY      History reviewed. No pertinent family history.   Social Connections: Not on file      Current Outpatient Medications:    budesonide (PULMICORT FLEXHALER) 180 MCG/ACT inhaler, Inhale 2 puffs into the lungs as needed (asthma)., Disp: , Rfl:    cetirizine (ZYRTEC ALLERGY) 10 MG tablet, Take 10 mg by mouth daily., Disp: , Rfl:    dicyclomine  (BENTYL ) 20 MG  tablet, Take 1 tablet (20 mg total) by mouth 2 (two) times daily. (Patient not taking: Reported on 10/18/2023), Disp: 20 tablet, Rfl: 0   dicyclomine  (BENTYL ) 20 MG tablet,  Take 1 tablet (20 mg total) by mouth 2 (two) times daily. (Patient not taking: Reported on 10/18/2023), Disp: 20 tablet, Rfl: 0   fluticasone (FLONASE) 50 MCG/ACT nasal spray, Place 2 sprays into both nostrils daily. prn, Disp: , Rfl:    ondansetron  (ZOFRAN  ODT) 4 MG disintegrating tablet, Take 1 tablet (4 mg total) by mouth every 8 (eight) hours as needed for nausea or vomiting. (Patient not taking: Reported on 10/18/2023), Disp: 20 tablet, Rfl: 0   polyethylene glycol (MIRALAX ) packet, Take 17 g by mouth daily. (Patient not taking: Reported on 10/18/2023), Disp: 14 each, Rfl: 0   THEOPHYLLINE PO, Take by mouth. (Patient not taking: Reported on 10/18/2023), Disp: , Rfl:    Physical Exam:   BP 127/88   Pulse (!) 56   Temp 98 F (36.7 C)   SpO2 98%   Salient findings:  CN II-XII intact Given history and complaints, ear microscopy was indicated and performed for evaluation with findings as below in physical exam section and in procedures; Bilateral EAC clear and TM intact with well pneumatized middle ear space -- of note, on right, pars flaccida is retracted without evidence of cholesteatoma; Right postauricular scar well healed Weber 512: right Rinne 512: AC > BC b/l  Anterior rhinoscopy: Septum intact; bilateral inferior turbinates without significant hypertrophy No lesions of oral cavity/oropharynx No obviously palpable neck masses/lymphadenopathy/thyromegaly No respiratory distress or stridor  Seprately Identifiable Procedures:  Prior to initiating any procedures, risks/benefits/alternatives were explained to the patient and verbal consent obtained. Procedure: Bilateral ear microscopy using microscope (CPT [id number]) Pre-procedure diagnosis: right conductive hearing loss Post-procedure diagnosis: same Indication: see above; given patient's otologic complaints and history, for improved and comprehensive examination of external ear and tympanic membrane, bilateral otologic examination using  microscope was performed. Prior to proceeding, verbal consent was obtained after discussion of R/B/A  Procedure: Patient was placed semi-recumbent. Both ear canals were examined using the microscope with findings above. Patient tolerated the procedure well.  Impression & Plans:  [name] is a 34 y.o. male with:  1. Conductive hearing loss of right ear with unrestricted hearing of left ear   2. Chronic mastoiditis of right side   3. Dysfunction of both eustachian tubes     Chronic right ear conductive hearing loss with Eustachian tube dysfunction with type B tympanogram on the right with persistent eustachian tube dysfunction symptoms including ear fullness, discomfort and inability to po the ear. He also has a mastoid effusion/c/f chronic mastoiditis status post right tympanomastoidectomy.   Having eustachian tube dysfunction symptoms with significant retraction on right.   CT and audio reviewed again  We discussed options currently: Do nothing Medical mgmt with steroids -- failed HA right side -- does not wish for this Right tymp tube --- good candidate but given prior TM perforation and retraction, would like to avoid currently Right revision tympanomastoidectomy with ET dilation  We discussed R/B/A for each option. Given his symptoms which are persistent, do think option 5 seems to be best to ensure ossicular chain intact and for any hearing improvement measures that need to occur.   We discussed hearing benefit is not a guarantee here given prior surgery. Continue flonase/zyrtec  Will schedule   - f/u POD 7  See below regarding exact medications prescribed this encounter including dosages  and route: No orders of the defined types were placed in this encounter.     Thank you for allowing me the opportunity to care for your patient. Please do not hesitate to contact me should you have any other questions.  Sincerely, Eldora Blanch, MD Otolaryngologist (ENT),  [facility] Phone: [phone] Fax: [phone]  01/24/2024, 10:04 AM   MDM:  I have personally spent 32 minutes involved in face-to-face and non-face-to-face activities for this patient on the day of the visit.  Professional time spent excludes any procedures performed but includes the following activities, in addition to those noted in the documentation: preparing to see the patient (review of outside documentation and results), performing a medically appropriate examination, counseling, documenting in the electronic health record, independently interpreting results
